# Patient Record
Sex: Female | Born: 1965 | Race: White | Hispanic: No | Smoking: Current some day smoker
Health system: Southern US, Community
[De-identification: ages and names within clinical notes are randomized; demographics above are authoritative.]

## PROBLEM LIST (undated history)

## (undated) DIAGNOSIS — K219 Gastro-esophageal reflux disease without esophagitis: Secondary | ICD-10-CM

## (undated) DIAGNOSIS — J45909 Unspecified asthma, uncomplicated: Secondary | ICD-10-CM

## (undated) DIAGNOSIS — M1990 Unspecified osteoarthritis, unspecified site: Secondary | ICD-10-CM

## (undated) DIAGNOSIS — G629 Polyneuropathy, unspecified: Secondary | ICD-10-CM

## (undated) HISTORY — PX: HERNIA REPAIR: SHX51

## (undated) HISTORY — PX: CHOLECYSTECTOMY: SHX55

## (undated) HISTORY — PX: TUBAL LIGATION: SHX77

## (undated) HISTORY — PX: TONSILLECTOMY: SUR1361

## (undated) HISTORY — PX: LAPAROSCOPIC GASTRIC SLEEVE RESECTION: SHX5895

---

## 2005-04-10 ENCOUNTER — Other Ambulatory Visit: Payer: Self-pay

## 2005-04-10 ENCOUNTER — Emergency Department: Payer: Self-pay | Admitting: Emergency Medicine

## 2006-08-20 ENCOUNTER — Ambulatory Visit: Payer: Self-pay | Admitting: Surgery

## 2006-08-21 ENCOUNTER — Ambulatory Visit: Payer: Self-pay | Admitting: Surgery

## 2007-11-13 ENCOUNTER — Ambulatory Visit: Payer: Self-pay | Admitting: Family Medicine

## 2014-06-26 ENCOUNTER — Ambulatory Visit
Admission: EM | Admit: 2014-06-26 | Discharge: 2014-06-26 | Disposition: A | Discharge status: Home / Self Care | Payer: BLUE CROSS/BLUE SHIELD | Attending: Family Medicine | Admitting: Family Medicine

## 2014-06-26 ENCOUNTER — Encounter: Payer: Self-pay | Admitting: Emergency Medicine

## 2014-06-26 DIAGNOSIS — B029 Zoster without complications: Secondary | ICD-10-CM

## 2014-06-26 DIAGNOSIS — R21 Rash and other nonspecific skin eruption: Secondary | ICD-10-CM

## 2014-06-26 HISTORY — DX: Unspecified asthma, uncomplicated: J45.909

## 2014-06-26 HISTORY — DX: Gastro-esophageal reflux disease without esophagitis: K21.9

## 2014-06-26 MED ORDER — MUPIROCIN 2 % EX OINT: 1.0000 "application " | TOPICAL_OINTMENT | Freq: Three times a day (TID) | CUTANEOUS | Status: DC

## 2014-06-26 MED ORDER — VALACYCLOVIR HCL 1 G PO TABS: 1000.0000 mg | ORAL_TABLET | Freq: Three times a day (TID) | ORAL | Status: AC

## 2014-06-26 NOTE — Discharge Instructions (Signed)
Shingles Shingles is caused by the same virus that causes chickenpox. The first feelings may be pain or tingling. A rash will follow in a couple days. The rash may occur on any area of the body. Long-lasting pain is more likely in an elderly person. It can last months to years. There are medicines that can help prevent pain if you start taking them early. HOME CARE   Take cool baths or place cool cloths on the rash as told by your doctor.  Take medicine only as told by your doctor.  Rest as told by your doctor.  Keep your rash clean with mild soap and cool water or as told by your doctor.  Do not scratch your rash. You may use calamine lotion to relieve itchy skin as told by your doctor.  Keep your rash covered with a loose bandage (dressing).  Avoid touching:  Babies.  Pregnant women.  Children with inflamed skin (eczema).  People who have gotten organ transplants.  People with chronic illnesses, such as leukemia or AIDS.  Wear loose-fitting clothing.  If the rash is on the face, you may need to see a specialist. Keep all appointments. Shingles must be kept away from the eyes, if possible.  Keep all follow-up visits as told by your doctor. GET HELP RIGHT AWAY IF:   You have any pain on the face or eye.  You lose feeling on one side of your face.  You have ear pain or ringing in your ear.  You cannot taste as well.  Your medicines do not help the pain.  Your redness or puffiness (swelling) spreads.  You feel like you are getting worse.  You have a fever. MAKE SURE YOU:   Understand these instructions.  Will watch your condition.  Will get help right away if you are not doing well or get worse. Document Released: 07/17/2007 Document Revised: 06/14/2013 Document Reviewed: 07/17/2007 ExitCare Patient Information 2015 [employer]. This information is not intended to replace advice given to you by your health care provider. Make sure you discuss any questions  you have with your health care provider.  

## 2014-06-26 NOTE — ED Provider Notes (Signed)
CSN: [id number]     Arrival date & time 06/26/14  1002 History   First MD Initiated Contact with Patient 06/26/14 1136     Chief Complaint  Patient presents with  . Rash   (Consider location/radiation/quality/duration/timing/severity/associated sxs/prior Treatment) Patient is a 49 y.o. female presenting with rash. The history is provided by the patient. No language interpreter was used.  Rash Location:  Head/neck (Rash started bothering her Thursday night and by Friday she saw a few bumps and is bumps only got worse between) Head/neck rash location:  L neck Quality: itchiness, painful, redness and weeping   Pain details:    Quality:  Burning   Severity:  Moderate   Onset quality:  Sudden   Duration:  4 days Severity:  Moderate Duration:  4 days Timing:  Constant Progression:  Worsening Chronicity:  New Context: not animal contact, not chemical exposure, not diapers, not eggs, not exposure to similar rash, not hot tub use, not insect bite/sting, not medications, not new detergent/soap, not nuts, not plant contact, not pollen, not sick contacts and not sun exposure   Relieved by:  Nothing Worsened by:  Nothing tried Ineffective treatments:  None tried Associated symptoms: induration   Associated symptoms: no abdominal pain, no fever, no sore throat and no tongue swelling     Past Medical History  Diagnosis Date  . Asthma   . GERD (gastroesophageal reflux disease)    Past Surgical History  Procedure Laterality Date  . Hernia repair    . Tonsillectomy    . Cholecystectomy    . Tubal ligation    . Laparoscopic gastric sleeve resection     History reviewed. No pertinent family history. History  Substance Use Topics  . Smoking status: Former Smoker    Quit date: 06/26/2007  . Smokeless tobacco: Former NeurosurgeonUser    Quit date: 06/26/2007  . Alcohol Use: Yes   OB History    No data available     Review of Systems  Constitutional: Negative.  Negative for fever.  HENT:  Negative for sore throat.   Gastrointestinal: Negative for abdominal pain.  Skin: Positive for rash.    Allergies  Review of patient's allergies indicates no known allergies.  Home Medications   Prior to Admission medications   Medication Sig Start Date End Date Taking? Authorizing Provider  fluticasone (FLOVENT HFA) 110 MCG/ACT inhaler Inhale 2 puffs into the lungs as needed.   Yes Historical Provider, MD  norethindrone-ethinyl estradiol 1/35 (ORTHO-NOVUM, NORTREL,CYCLAFEM) tablet Take 1 tablet by mouth daily.   Yes Historical Provider, MD  omeprazole (PRILOSEC) 40 MG capsule Take 40 mg by mouth daily.   Yes Historical Provider, MD  ranitidine (ZANTAC) 150 MG tablet Take 150 mg by mouth 2 (two) times daily.   Yes Historical Provider, MD  tiotropium (SPIRIVA) 18 MCG inhalation capsule Place 18 mcg into inhaler and inhale as needed.   Yes Historical Provider, MD  mupirocin ointment (BACTROBAN) 2 % Apply 1 application topically 3 (three) times daily. 06/26/14   Hassan RowanEugene Kasen Adduci, MD  valACYclovir (VALTREX) 1000 MG tablet Take 1 tablet (1,000 mg total) by mouth 3 (three) times daily. For one week 06/26/14 07/10/14  Hassan RowanEugene Brinkley Peet, MD   BP 127/77 mmHg  Pulse 60  Temp(Src) 97.8 F (36.6 C) (Oral)  Resp 18  Ht 5\' 1"  (1.549 m)  Wt 271 lb (122.925 kg)  BMI 51.23 kg/m2  SpO2 99%  LMP 06/24/2014 Physical Exam  Constitutional: She is oriented to person, place, and time.  Obese white female  Eyes: Pupils are equal, round, and reactive to light.  Neck: Neck supple.  Musculoskeletal: Normal range of motion.  Lymphadenopathy:    She has cervical adenopathy.  Neurological: She is oriented to person, place, and time.  Skin: Rash noted. There is erythema.  She has a rash on the left side of her neck that is moving along what appears be a dermatome pattern towards her face. This rash could be consistent with an early shingles infection. Should be noted that she did fine and none embedded tick on her neck  Thursday this is a multiple lesion illness right now and she never felt a bite from the tic  Vitals reviewed.   ED Course  Procedures (including critical care time) Labs Review Labs Reviewed - No data to display  Imaging Review No results found. She talked to a nurse at the office she goes to who told her she needed to come here to be placed on antiviral agent and she is requesting antiviral age as I tried to explain to her what shingles is and why we treat shingles infections. We'll place on Valtrex 1gr for a week 3 times a day and bactroban ointment. MDM   1. Shingles outbreak   2. Rash        Hassan RowanEugene Jaquell Seddon, MD 06/26/14 [phone]

## 2014-06-26 NOTE — ED Notes (Signed)
Patient c/o rashes to the lt neck behind the lt ear, thinks that it could be shingles, c/o itching and redness since 2 days, denies any other symptoms

## 2014-06-29 ENCOUNTER — Encounter: Payer: Self-pay | Admitting: Family Medicine

## 2014-06-29 ENCOUNTER — Ambulatory Visit
Admission: EM | Admit: 2014-06-29 | Discharge: 2014-06-29 | Disposition: A | Discharge status: Home / Self Care | Payer: BLUE CROSS/BLUE SHIELD | Attending: Family Medicine | Admitting: Family Medicine

## 2014-06-29 DIAGNOSIS — L03116 Cellulitis of left lower limb: Secondary | ICD-10-CM | POA: Diagnosis not present

## 2014-06-29 MED ORDER — SULFAMETHOXAZOLE-TRIMETHOPRIM 800-160 MG PO TABS: 1.0000 | ORAL_TABLET | Freq: Two times a day (BID) | ORAL | Status: AC

## 2014-06-29 NOTE — ED Notes (Signed)
Om Mothers day 10 days ago she hit her toe under the door as she was coming out of the bathroom. Her foot and lower leg is red and swollen. Her great toe has a bruise.

## 2014-06-29 NOTE — ED Provider Notes (Signed)
Patient presents with injury to the left foot area for over a week. Patient states that she hit her toe under the door. Since that time the patient has noticed redness swelling of the left big toe has now extended to the foot and ankle area. Patient denies any calf pain. She denies any fever or chills. She does admit to pus and blood coming from the left big toe a few days ago. Patient has full motion of her toes and foot.  ROS: Negative except mentioned above.  GENERAL: NAD HEENT: no pharyngeal erythema, no exudate, no erythema of TMs RESP: CTA B CARD: RRR MSK: LLE: mild erythema and tenderness around left big toe cuticle area, no pus pockets, no active bleeding, minimal erythema and tenderness of the dorsal aspect of the foot, moderate erythema, warmth, and tenderness of the distal lower leg area (anterior ankle area), FROM of toes and foot, no streaks to upper leg area, -Homans, nv intact  NEURO: CN II-XII groslly intact     A/P: LLE Cellulitis-x-rays of the toe were declined by patient, Bactrim DS 10 days, encourage patient on elevating the foot/leg, would like patient to return in 2 days for follow-up. Patient is to return sooner if symptoms worsen.  Jolene ProvostKirtida Darrelle Wiberg, MD 06/29/14 [phone]

## 2014-07-01 ENCOUNTER — Ambulatory Visit
Admission: EM | Admit: 2014-07-01 | Discharge: 2014-07-01 | Disposition: A | Discharge status: Home / Self Care | Payer: BLUE CROSS/BLUE SHIELD | Attending: Family Medicine | Admitting: Family Medicine

## 2014-07-01 ENCOUNTER — Ambulatory Visit: Payer: BLUE CROSS/BLUE SHIELD

## 2014-07-01 DIAGNOSIS — M19072 Primary osteoarthritis, left ankle and foot: Secondary | ICD-10-CM

## 2014-07-01 DIAGNOSIS — Z87891 Personal history of nicotine dependence: Secondary | ICD-10-CM | POA: Insufficient documentation

## 2014-07-01 DIAGNOSIS — M129 Arthropathy, unspecified: Secondary | ICD-10-CM | POA: Diagnosis not present

## 2014-07-01 DIAGNOSIS — M19079 Primary osteoarthritis, unspecified ankle and foot: Secondary | ICD-10-CM

## 2014-07-01 DIAGNOSIS — J45909 Unspecified asthma, uncomplicated: Secondary | ICD-10-CM | POA: Insufficient documentation

## 2014-07-01 DIAGNOSIS — M1990 Unspecified osteoarthritis, unspecified site: Secondary | ICD-10-CM

## 2014-07-01 DIAGNOSIS — M7732 Calcaneal spur, left foot: Secondary | ICD-10-CM | POA: Insufficient documentation

## 2014-07-01 DIAGNOSIS — L03116 Cellulitis of left lower limb: Secondary | ICD-10-CM | POA: Diagnosis not present

## 2014-07-01 DIAGNOSIS — K219 Gastro-esophageal reflux disease without esophagitis: Secondary | ICD-10-CM | POA: Insufficient documentation

## 2014-07-01 MED ORDER — CEFAZOLIN SODIUM 1 G IJ SOLR
1.0000 g | Freq: Once | INTRAMUSCULAR | Status: AC
  Administered 2014-07-01: 1 g via INTRAMUSCULAR

## 2014-07-01 MED ORDER — CIPROFLOXACIN HCL 500 MG PO TABS: 500.0000 mg | ORAL_TABLET | Freq: Two times a day (BID) | ORAL | Status: DC

## 2014-07-01 MED ORDER — ACETAMINOPHEN 325 MG PO TABS: 975.0000 mg | ORAL_TABLET | Freq: Four times a day (QID) | ORAL | Status: AC | PRN

## 2014-07-01 NOTE — Discharge Instructions (Signed)
Arthritis, Nonspecific Arthritis is pain, redness, warmth, or puffiness (inflammation) of a joint. The joint may be stiff or hurt when you move it. One or more joints may be affected. There are many types of arthritis. Your doctor may not know what type you have right away. The most common cause of arthritis is wear and tear on the joint (osteoarthritis). HOME CARE   Only take medicine as told by your doctor.  Rest the joint as much as possible.  Raise (elevate) your joint if it is puffy.  Use crutches if the painful joint is in your leg.  Drink enough fluids to keep your pee (urine) clear or pale yellow.  Follow your doctor's diet instructions.  Use cold packs for very bad joint pain for 10 to 15 minutes every hour. Ask your doctor if it is okay for you to use hot packs.  Exercise as told by your doctor.  Take a warm shower if you have stiffness in the morning.  Move your sore joints throughout the day. GET HELP RIGHT AWAY IF:   You have a fever.  You have very bad joint pain, puffiness, or redness.  You have many joints that are painful and puffy.  You are not getting better with treatment.  You have very bad back pain or leg weakness.  You cannot control when you poop (bowel movement) or pee (urinate).  You do not feel better in 24 hours or are getting worse.  You are having side effects from your medicine. MAKE SURE YOU:   Understand these instructions.  Will watch your condition.  Will get help right away if you are not doing well or get worse. Document Released: 04/24/2009 Document Revised: 07/30/2011 Document Reviewed: 04/24/2009 [facility] Patient Information 2015 [employer], [place]. This information is not intended to replace advice given to you by your health care provider. Make sure you discuss any questions you have with your health care provider. Cellulitis Cellulitis is an infection of the skin and the tissue beneath it. The infected area is usually red and  tender. Cellulitis occurs most often in the arms and lower legs.  CAUSES  Cellulitis is caused by bacteria that enter the skin through cracks or cuts in the skin. The most common types of bacteria that cause cellulitis are staphylococci and streptococci. SIGNS AND SYMPTOMS   Redness and warmth.  Swelling.  Tenderness or pain.  Fever. DIAGNOSIS  Your health care provider can usually determine what is wrong based on a physical exam. Blood tests may also be done. TREATMENT  Treatment usually involves taking an antibiotic medicine. HOME CARE INSTRUCTIONS   Take your antibiotic medicine as directed by your health care provider. Finish the antibiotic even if you start to feel better.  Keep the infected arm or leg elevated to reduce swelling.  Apply a warm cloth to the affected area up to 4 times per day to relieve pain.  Take medicines only as directed by your health care provider.  Keep all follow-up visits as directed by your health care provider. SEEK MEDICAL CARE IF:   You notice red streaks coming from the infected area.  Your red area gets larger or turns dark in color.  Your bone or joint underneath the infected area becomes painful after the skin has healed.  Your infection returns in the same area or another area.  You notice a swollen bump in the infected area.  You develop new symptoms.  You have a fever. SEEK IMMEDIATE MEDICAL CARE IF:  You feel very sleepy.  You develop vomiting or diarrhea.  You have a general ill feeling (malaise) with muscle aches and pains. MAKE SURE YOU:   Understand these instructions.  Will watch your condition.  Will get help right away if you are not doing well or get worse. Document Released: 11/07/2004 Document Revised: 06/14/2013 Document Reviewed: 04/15/2011 [facility] InstituteExit[facility] Patient Information 2015 [employer]Care, [place]. This information is not intended to replace advice given to you by your health care provider. Make sure you discuss  any questions you have with your health care provider.

## 2014-07-01 NOTE — ED Provider Notes (Signed)
CSN: [id number]     Arrival date & time 07/01/14  0806 History   None    Chief Complaint  Patient presents with  . 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  Seen 18 May Dr Allena Katz for left lower extremity cellulitis started on Bactrim DS 5 doses completed and stayed home from work x 2 days elevated extremity.  Worsening pain, erythema of toes but pussy drainage left great toe resolved.  Redness anterior shin decreased but still having pain and itching.  Decreased AROM flexion of toes and more pain with toe flexion left foot today.  Patient reports history of bilateral lower leg swelling and discoloration of skin chronic wears support stockings below the knee at work and has tried to lose weight approximately 100lbs per patient in past couple of years.  Patient supposed to return to work today.  Patient is a 49 y.o. female presenting with wound check. The history is provided by the patient.  Wound Check This is a chronic problem. The current episode started more than 2 days ago. The problem occurs constantly. The problem has been gradually worsening. Pertinent negatives include no chest pain, no abdominal pain, no headaches and no shortness of breath. The symptoms are aggravated by standing, exertion and walking. Nothing relieves the symptoms. She has tried rest for the symptoms. The treatment provided mild relief.    Past Medical History  Diagnosis Date  . Asthma   . GERD (gastroesophageal reflux disease)    Past Surgical History  Procedure Laterality Date  . Hernia repair    . Tonsillectomy    . Cholecystectomy    . Tubal ligation    . Laparoscopic gastric sleeve resection     Family History  Problem Relation Age of Onset  . Cancer Mother    History  Substance Use Topics  . Smoking  status: Former Smoker    Quit date: 06/26/2007  . Smokeless tobacco: Former Neurosurgeon    Quit date: 06/26/2007  . Alcohol Use: Yes     Comment: socially   OB History    Gravida Para Term Preterm AB TAB SAB Ectopic Multiple Living   2 2             Review of Systems  Constitutional: Negative for fever, chills, diaphoresis, activity change, appetite change and fatigue.  HENT: Negative for congestion, postnasal drip, rhinorrhea, sneezing and sore throat.   Eyes: Negative for photophobia, pain, discharge, redness and itching.  Respiratory: Negative for cough, choking, chest tightness and shortness of breath.   Cardiovascular: Positive for leg swelling. Negative for chest pain and palpitations.  Gastrointestinal: Negative for nausea, vomiting, abdominal pain, diarrhea and constipation.  Endocrine: Negative for cold intolerance and heat intolerance.  Genitourinary: Negative for dysuria and difficulty urinating.  Musculoskeletal: Positive for myalgias, joint swelling, arthralgias and gait problem. Negative for back pain, neck pain and neck stiffness.  Skin: Positive for color change and rash. Negative for pallor and wound.  Allergic/Immunologic: Negative for environmental allergies and food allergies.  Neurological: Negative for dizziness, tremors, syncope, weakness, light-headedness, numbness and headaches.  Hematological: Does not bruise/bleed easily.  Psychiatric/Behavioral: Negative for confusion and agitation. The patient is not nervous/anxious and is not hyperactive.     Allergies  Review of patient's allergies indicates no known allergies.  Home Medications   Prior to Admission medications   Medication Sig  Start Date End Date Taking? Authorizing Provider  fluticasone (FLOVENT HFA) 110 MCG/ACT inhaler Inhale 2 puffs into the lungs as needed.   Yes Historical Provider, MD  mupirocin ointment (BACTROBAN) 2 % Apply 1 application topically 3 (three) times daily. 06/26/14  Yes Hassan RowanEugene Wade,  MD  norethindrone-ethinyl estradiol 1/35 (ORTHO-NOVUM, NORTREL,CYCLAFEM) tablet Take 1 tablet by mouth daily.   Yes Historical Provider, MD  omeprazole (PRILOSEC) 40 MG capsule Take 40 mg by mouth daily.   Yes Historical Provider, MD  ranitidine (ZANTAC) 150 MG tablet Take 150 mg by mouth 2 (two) times daily.   Yes Historical Provider, MD  sulfamethoxazole-trimethoprim (BACTRIM DS,SEPTRA DS) 800-160 MG per tablet Take 1 tablet by mouth 2 (two) times daily. 06/29/14 07/09/14 Yes Jolene ProvostKirtida Patel, MD  tiotropium (SPIRIVA) 18 MCG inhalation capsule Place 18 mcg into inhaler and inhale as needed.   Yes Historical Provider, MD  valACYclovir (VALTREX) 1000 MG tablet Take 1 tablet (1,000 mg total) by mouth 3 (three) times daily. For one week 06/26/14 07/10/14 Yes Hassan RowanEugene Wade, MD  acetaminophen (TYLENOL) 325 MG tablet Take 3 tablets (975 mg total) by mouth every 6 (six) hours as needed for moderate pain. 07/01/14   Barbaraann Barthelina A Betancourt, NP  ciprofloxacin (CIPRO) 500 MG tablet Take 1 tablet (500 mg total) by mouth every 12 (twelve) hours. 07/01/14   Jarold Songina A Betancourt, NP   BP 146/81 mmHg  Pulse 74  Temp(Src) 98.5 F (36.9 C) (Tympanic)  Resp 16  Ht 5\' 1"  (1.549 m)  Wt 271 lb (122.925 kg)  BMI 51.23 kg/m2  SpO2 100%  LMP 06/24/2014 (Approximate) Physical Exam  Constitutional: She is oriented to person, place, and time. Vital signs are normal. She appears well-developed and well-nourished. No distress.  HENT:  Head: Normocephalic and atraumatic.  Right Ear: External ear normal.  Left Ear: External ear normal.  Mouth/Throat: Oropharynx is clear and moist. No oropharyngeal exudate.  Eyes: Conjunctivae, EOM and lids are normal. Pupils are equal, round, and reactive to light. Right eye exhibits no discharge. Left eye exhibits no discharge. No scleral icterus.  Neck: Trachea normal and normal range of motion. Neck supple. No tracheal deviation present.  Cardiovascular: Normal rate, regular rhythm, normal heart  sounds and intact distal pulses.   Pulmonary/Chest: Effort normal and breath sounds normal. No respiratory distress. She has no wheezes.  Musculoskeletal: She exhibits edema and tenderness.       Left ankle: She exhibits decreased range of motion, swelling and ecchymosis. She exhibits no deformity, no laceration and normal pulse. Tenderness. Achilles tendon normal. Achilles tendon exhibits no pain, no defect and normal Thompson's test results.       Legs:      Feet:  Neurological: She is alert and oriented to person, place, and time. She displays normal reflexes.  Skin: Skin is warm, dry and intact. Rash noted. She is not diaphoretic. There is erythema. No pallor.  Psychiatric: She has a normal mood and affect. Her speech is normal and behavior is normal. Judgment and thought content normal. Cognition and memory are normal.  Nursing note and vitals reviewed.   ED Course  Procedures (including critical care time) Labs Review Labs Reviewed - No data to display  Imaging Review Dg Foot Complete Left  07/01/2014   CLINICAL DATA:  Cellulitis of the left foot. Blunt trauma and laceration 1.5 weeks ago. Pain. Redness.  EXAM: LEFT FOOT - COMPLETE 3+ VIEW  COMPARISON:  None.  FINDINGS: There is no fracture or dislocation. Moderate  midfoot arthritis. Slight arthritic changes at the ankle joint. Prominent plantar calcaneal spur. Slight are severe arthritis of the first metatarsal phalangeal joint with slight hallux valgus deformity and bunion formation.  No evidence of bone destruction or other findings suggestive of osteomyelitis.  IMPRESSION: No acute osseous abnormalities. Degenerative changes as described. No findings suggestive of osteomyelitis.   Electronically Signed   By: Francene BoyersJames  Maxwell M.D.   On: 07/01/2014 09:27     MDM   1. Cellulitis of left lower extremity   2. Arthritis of foot, left   3. Calcaneal spur of foot, left   4. 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  Ancef 1gm IM x 1 and will add ciprofloxacin 500mg  po BID to Bactrim DS po BID continue both until finished.  Discussed with patient to use back up contraception this month e.g. Condoms or abstinence.  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  Continue bactroban topical to left great toe.  May take tylenol 1000mg  po QID prn pain.   Follow up for re-evaluation with Dr Allena KatzPatel in am 02 Jul 2014.  Exitcare handout on skin infection given to patient.  RTC if worsening erythema, joint or muscle pain, purulent discharge, fever greater than 100.5.  Patient verbalized understanding, agreed with plan of care and had no further questions at this time.   0930 patient notified xray results e.g. No osteomyelitis but arthritis great toe/midfoot left and calcaneal spur.  Discussed slightly elevated heel, large toebox, supportive foot plates recommended along with tylenol and heat for arthritis.  Patient given copy of report.  Patient verbalized understanding of information/instructions, agreed with plan of care and had no further questions at this time.  EXAM: LEFT FOOT - COMPLETE 3+ VIEW  COMPARISON: None.  FINDINGS: There is no fracture or dislocation. Moderate midfoot arthritis. Slight arthritic changes at the ankle joint. Prominent  plantar calcaneal spur. Slight are severe arthritis of the first metatarsal phalangeal joint with slight hallux valgus deformity and bunion formation.  No evidence of bone destruction or other findings suggestive of osteomyelitis.  IMPRESSION: No acute osseous abnormalities. Degenerative changes as described. No findings suggestive of osteomyelitis.   Electronically Signed  By: Francene BoyersJames Maxwell M.D.  On: 07/01/2014 09:27  Barbaraann Barthelina A Betancourt, NP 07/01/14 [phone]

## 2014-07-01 NOTE — ED Notes (Signed)
1 1/2 weeks ago stubbed left great toe and started with cellulitis. Less swelling but severe burning pain. Left toes "itching" For recheck

## 2016-11-11 IMAGING — CR DG FOOT COMPLETE 3+V*L*
4 series · 4 of 4 positions shown · non-contrast
Comparison: None.

CLINICAL DATA: Cellulitis of the left foot. Blunt trauma and
laceration 1.5 weeks ago. Pain. Redness.

EXAM:
LEFT FOOT - COMPLETE 3+ VIEW

[foot ap]
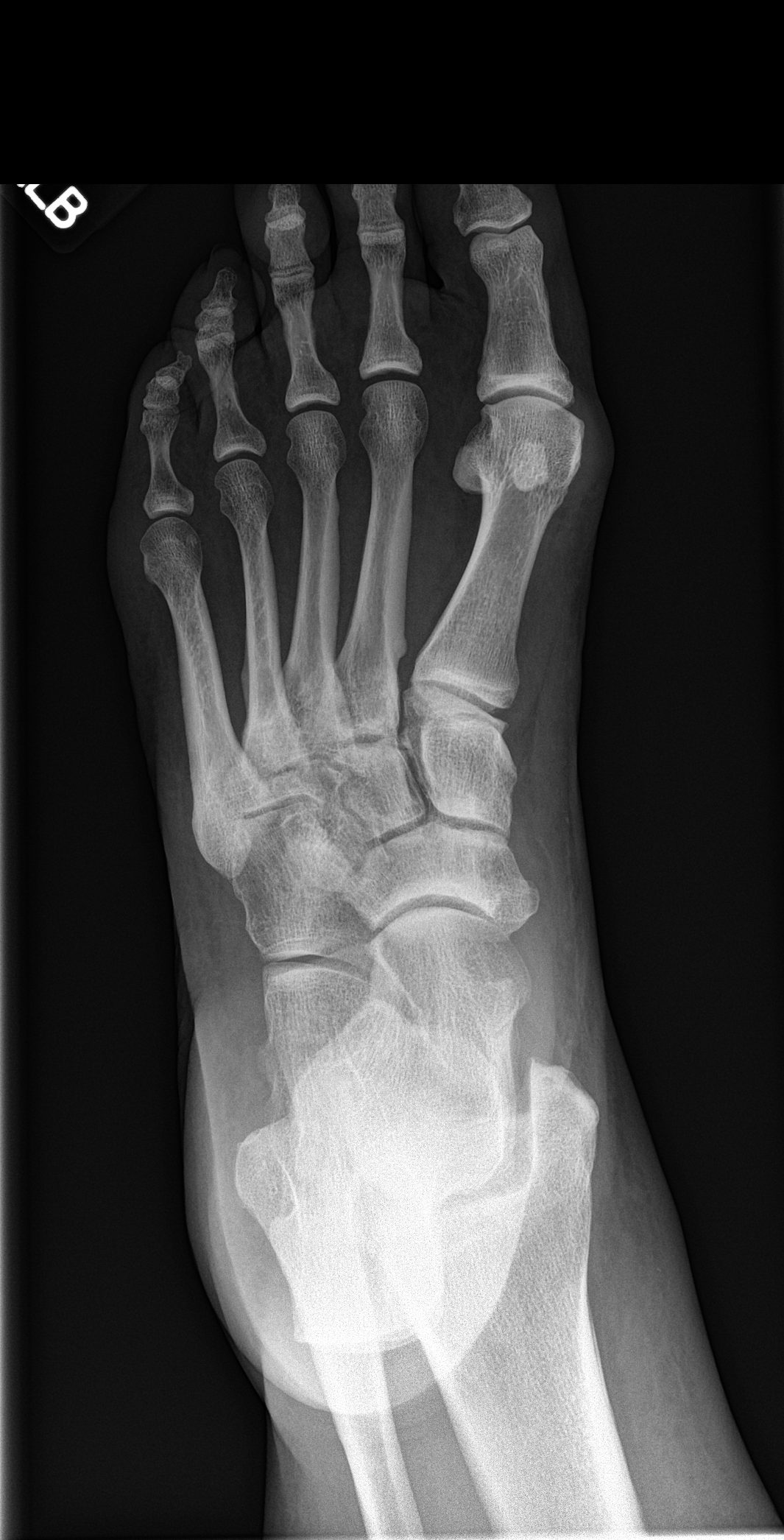

[foot obl]
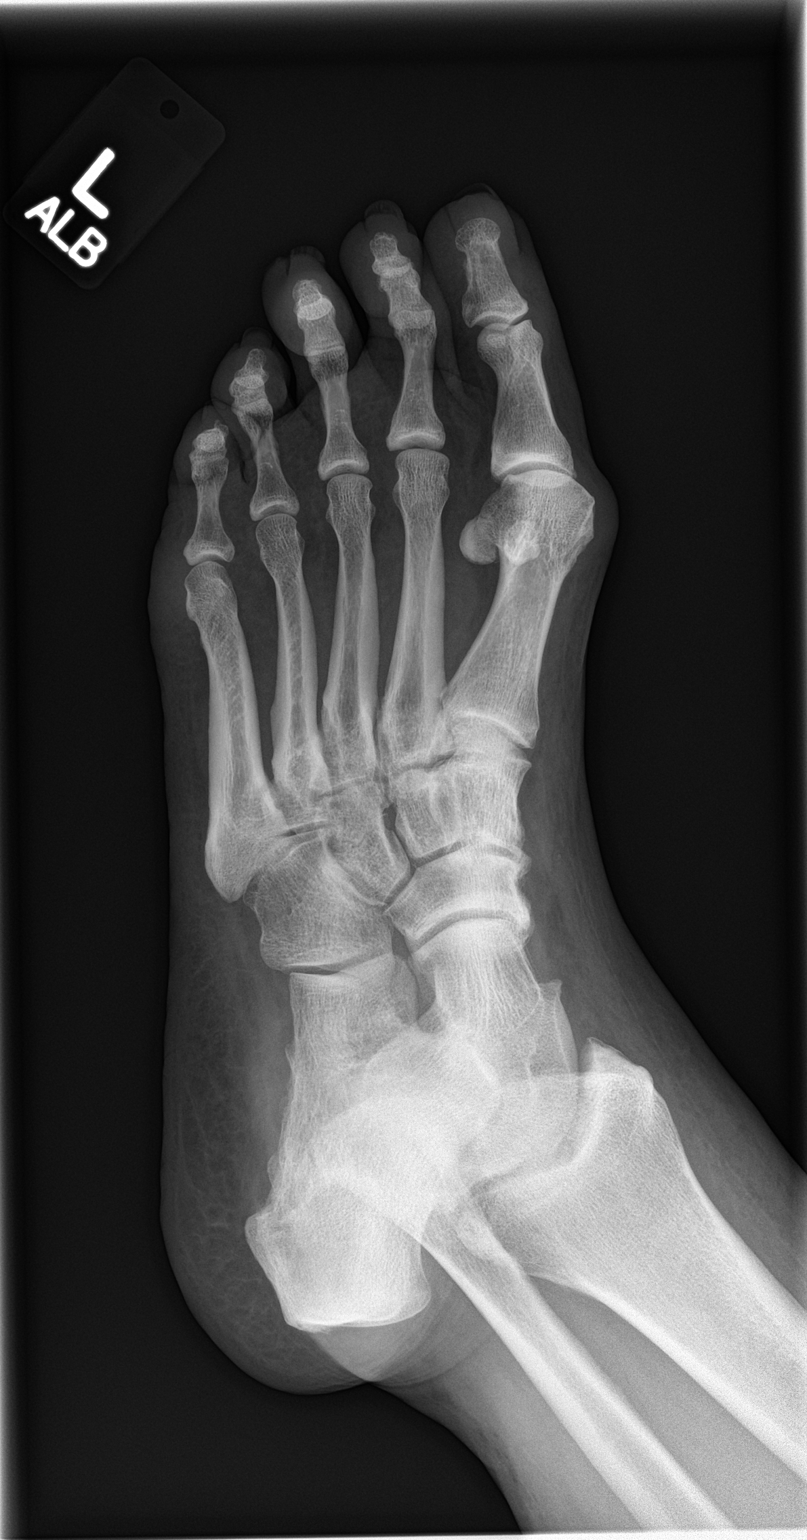

[foot lat (1 of 2)]
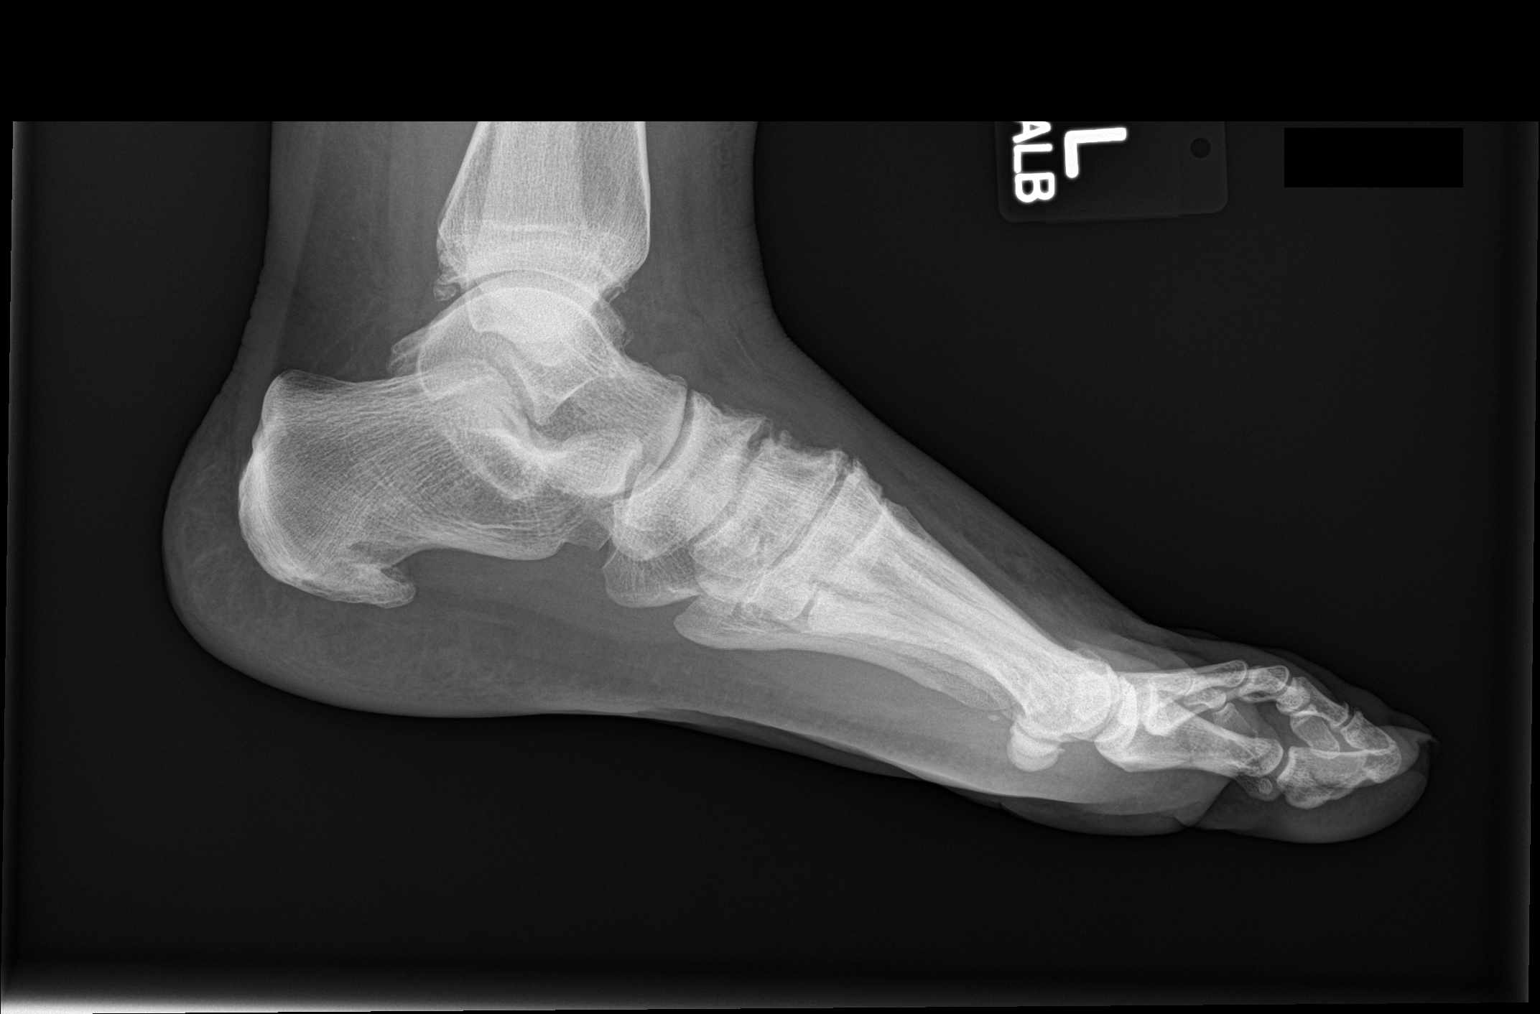

[foot lat (2 of 2)]
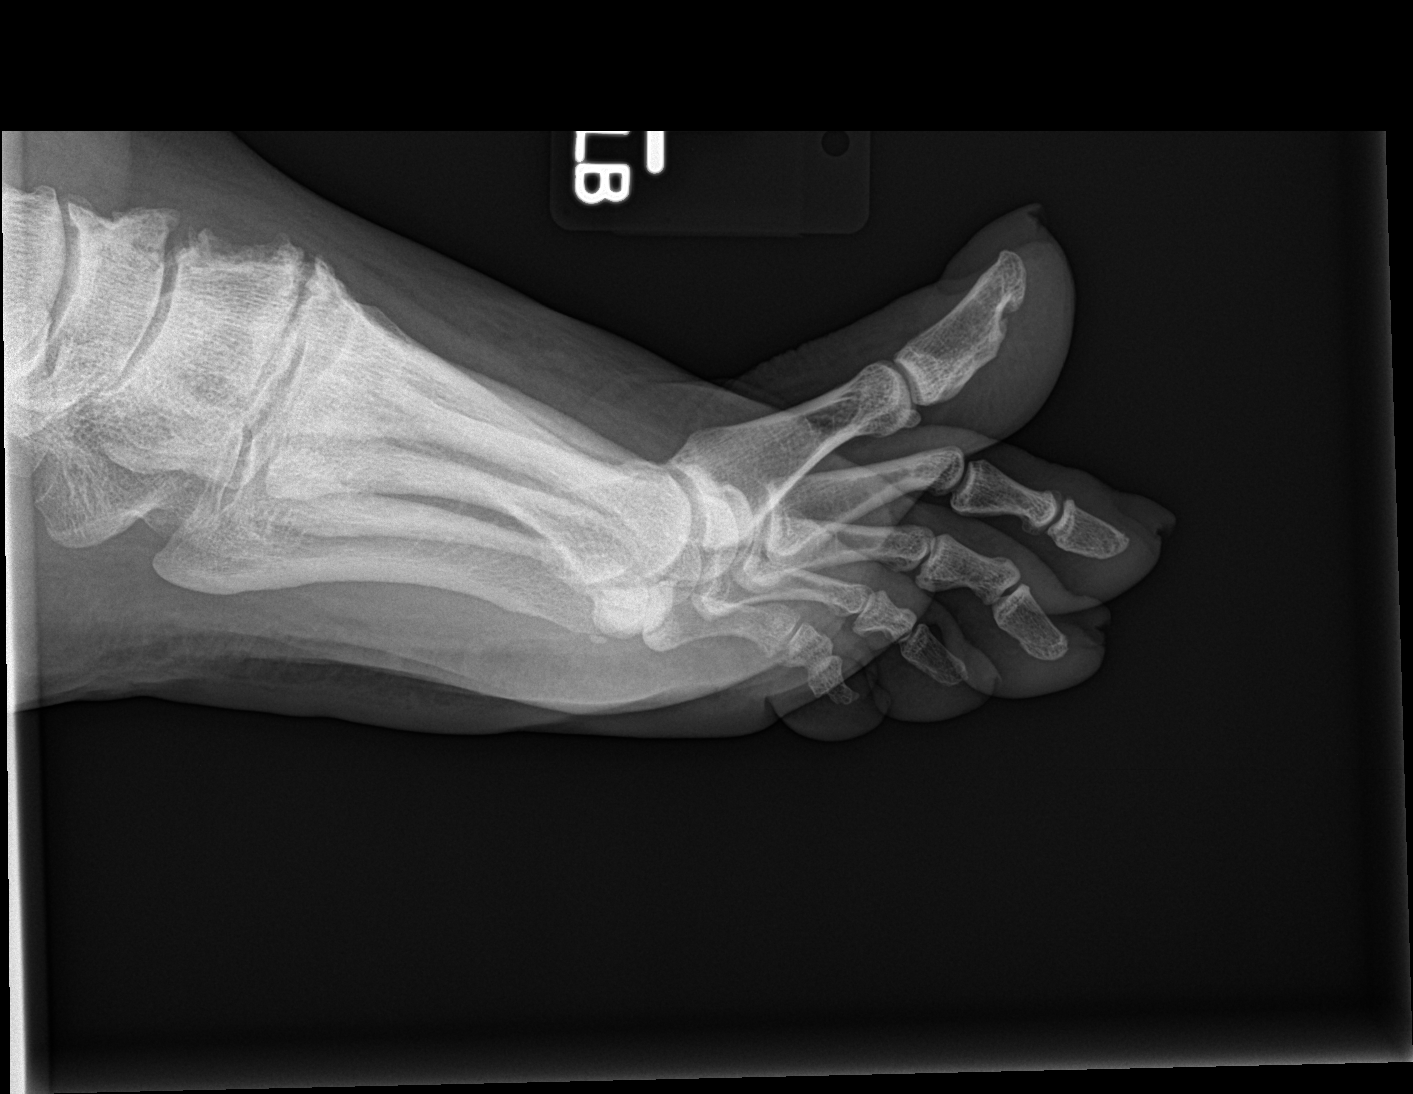

[4 of 4 positions shown; findings below may reference images not displayed]

FINDINGS: There is no fracture or dislocation. Moderate midfoot arthritis.
Slight arthritic changes at the ankle joint. Prominent plantar
calcaneal spur. Slight are severe arthritis of the first metatarsal
phalangeal joint with slight hallux valgus deformity and bunion
formation.

No evidence of bone destruction or other findings suggestive of
osteomyelitis.
IMPRESSION: No acute osseous abnormalities. Degenerative changes as described.
No findings suggestive of osteomyelitis.

## 2017-05-22 ENCOUNTER — Other Ambulatory Visit: Payer: Self-pay | Admitting: Family Medicine

## 2017-05-22 DIAGNOSIS — Z1231 Encounter for screening mammogram for malignant neoplasm of breast: Secondary | ICD-10-CM

## 2017-05-26 ENCOUNTER — Other Ambulatory Visit: Payer: Self-pay | Admitting: Family Medicine

## 2017-05-29 ENCOUNTER — Other Ambulatory Visit: Payer: Self-pay | Admitting: Family Medicine

## 2017-05-29 DIAGNOSIS — N6320 Unspecified lump in the left breast, unspecified quadrant: Secondary | ICD-10-CM

## 2017-06-16 ENCOUNTER — Ambulatory Visit
Admission: RE | Admit: 2017-06-16 | Discharge: 2017-06-16 | Disposition: A | Discharge status: Home / Self Care | Payer: BLUE CROSS/BLUE SHIELD | Source: Ambulatory Visit | Attending: Family Medicine | Admitting: Family Medicine

## 2017-06-16 DIAGNOSIS — N6320 Unspecified lump in the left breast, unspecified quadrant: Secondary | ICD-10-CM

## 2017-06-16 DIAGNOSIS — Z1231 Encounter for screening mammogram for malignant neoplasm of breast: Secondary | ICD-10-CM

## 2017-06-16 DIAGNOSIS — N6311 Unspecified lump in the right breast, upper outer quadrant: Secondary | ICD-10-CM | POA: Insufficient documentation

## 2017-09-28 ENCOUNTER — Other Ambulatory Visit: Payer: Self-pay

## 2017-09-28 ENCOUNTER — Ambulatory Visit
Admission: EM | Admit: 2017-09-28 | Discharge: 2017-09-28 | Disposition: A | Discharge status: Home / Self Care | Payer: BLUE CROSS/BLUE SHIELD | Attending: Internal Medicine | Admitting: Internal Medicine

## 2017-09-28 DIAGNOSIS — L03113 Cellulitis of right upper limb: Secondary | ICD-10-CM | POA: Diagnosis not present

## 2017-09-28 HISTORY — DX: Polyneuropathy, unspecified: G62.9

## 2017-09-28 HISTORY — DX: Unspecified osteoarthritis, unspecified site: M19.90

## 2017-09-28 MED ORDER — CEPHALEXIN 500 MG PO CAPS: 500.0000 mg | ORAL_CAPSULE | Freq: Three times a day (TID) | ORAL | 0 refills | Status: AC

## 2017-09-28 NOTE — ED Provider Notes (Signed)
[facility]    CSN: [id number] Arrival date & time: 09/28/17  1425     History   Chief Complaint Chief Complaint  Patient presents with  . Cellulitis    HPI Catherine Nolan is a 52 y.o. female.   PAtient fell 5 days ago and sustained abrasion to right forearm.  Initially red and weeping. Patient reports expressing blood and pus from the wound. Swelling receded but it is now more painful and hot to touch. She denies fever, nausea, or vomiting.     Past Medical History:  Diagnosis Date  . Arthritis   . Asthma   . GERD (gastroesophageal reflux disease)   . Neuropathy     There are no active problems to display for this patient.   Past Surgical History:  Procedure Laterality Date  . CHOLECYSTECTOMY    . HERNIA REPAIR    . LAPAROSCOPIC GASTRIC SLEEVE RESECTION    . TONSILLECTOMY    . TUBAL LIGATION      OB History    Gravida  2   Para  2   Term      Preterm      AB      Living        SAB      TAB      Ectopic      Multiple      Live Births               Home Medications    Prior to Admission medications   Medication Sig Start Date End Date Taking? Authorizing Provider  albuterol (PROAIR HFA) 108 (90 Base) MCG/ACT inhaler Inhale into the lungs. 10/04/13  Yes [provider]  gabapentin (NEURONTIN) 800 MG tablet Take by mouth. 10/20/13  Yes [provider]  acetaminophen (TYLENOL) 325 MG tablet Take 3 tablets (975 mg total) by mouth every 6 (six) hours as needed for moderate pain. 07/01/14   Betancourt, Jarold Songina A, NP  cephALEXin (KEFLEX) 500 MG capsule Take 1 capsule (500 mg total) by mouth 3 (three) times daily for 7 days. 09/28/17 10/05/17  Arnaldo Nataliamond, Kimari Coudriet S, MD  fluticasone (FLOVENT HFA) 110 MCG/ACT inhaler Inhale 2 puffs into the lungs as needed.    [provider]  meloxicam (MOBIC) 7.5 MG tablet Take by mouth.    [provider]  norethindrone-ethinyl estradiol 1/35 (ORTHO-NOVUM, NORTREL,CYCLAFEM)  tablet Take 1 tablet by mouth daily.    [provider]  omeprazole (PRILOSEC) 40 MG capsule Take 40 mg by mouth daily.    [provider]  ranitidine (ZANTAC) 150 MG tablet Take 150 mg by mouth 2 (two) times daily.    [provider]  tiotropium (SPIRIVA) 18 MCG inhalation capsule Place 18 mcg into inhaler and inhale as needed.    [provider]    Family History Family History  Problem Relation Age of Onset  . Cancer Mother     Social History Social History   Tobacco Use  . Smoking status: Current Some Day Smoker    Types: Cigarettes  . Smokeless tobacco: Former NeurosurgeonUser    Quit date: 06/26/2007  Substance Use Topics  . Alcohol use: Yes    Comment: socially  . Drug use: No     Allergies   Patient has no known allergies.   Review of Systems Review of Systems  Constitutional: Negative for chills and fever.  HENT: Negative for sore throat and tinnitus.   Eyes: Negative for redness.  Respiratory:  Negative for cough and shortness of breath.   Cardiovascular: Negative for chest pain and palpitations.  Gastrointestinal: Negative for abdominal pain, diarrhea, nausea and vomiting.  Genitourinary: Negative for dysuria, frequency and urgency.  Musculoskeletal: Negative for myalgias.  Skin: Positive for wound. Negative for rash.       No lesions  Neurological: Negative for weakness.  Hematological: Does not bruise/bleed easily.  Psychiatric/Behavioral: Negative for suicidal ideas.     Physical Exam Triage Vital Signs ED Triage Vitals  Enc Vitals Group     BP 09/28/17 1442 133/90     Pulse Rate 09/28/17 1442 72     Resp 09/28/17 1442 18     Temp 09/28/17 1442 97.9 F (36.6 C)     Temp Source 09/28/17 1442 Oral     SpO2 09/28/17 1442 99 %     Weight 09/28/17 1443 220 lb (99.8 kg)     Height 09/28/17 1443 5' (1.524 m)     Head Circumference --      Peak Flow --      Pain Score 09/28/17 1443 8     Pain Loc --      Pain Edu? --       Excl. in GC? --    No data found.  Updated Vital Signs BP 133/90 (BP Location: Right Arm)   Pulse 72   Temp 97.9 F (36.6 C) (Oral)   Resp 18   Ht 5' (1.524 m)   Wt 99.8 kg   LMP 06/24/2014 (Approximate) Comment: denies preg  SpO2 99%   BMI 42.97 kg/m   Visual Acuity Right Eye Distance:   Left Eye Distance:   Bilateral Distance:    Right Eye Near:   Left Eye Near:    Bilateral Near:     Physical Exam  Constitutional: She is oriented to person, place, and time. She appears well-developed and well-nourished. No distress.  HENT:  Head: Normocephalic and atraumatic.  Mouth/Throat: Oropharynx is clear and moist.  Eyes: Pupils are equal, round, and reactive to light. Conjunctivae and EOM are normal. No scleral icterus.  Neck: Normal range of motion. Neck supple. No JVD present. No tracheal deviation present. No thyromegaly present.  Cardiovascular: Normal rate, regular rhythm and normal heart sounds. Exam reveals no gallop and no friction rub.  No murmur heard. Pulmonary/Chest: Effort normal and breath sounds normal.  Abdominal: Soft. Bowel sounds are normal. She exhibits no distension. There is no tenderness.  Musculoskeletal: Normal range of motion. She exhibits no edema.  Lymphadenopathy:    She has no cervical adenopathy.  Neurological: She is alert and oriented to person, place, and time. No cranial nerve deficit.  Skin: Skin is warm and dry.  Abrasion of right forearm/elbow with erythema, warmth and swelling  Psychiatric: She has a normal mood and affect. Her behavior is normal. Judgment and thought content normal.  Nursing note and vitals reviewed.    UC Treatments / Results  Labs (all labs ordered are listed, but only abnormal results are displayed) Labs Reviewed - No data to display  EKG None  Radiology No results found.  Procedures Procedures (including critical care time)  Medications Ordered in UC Medications - No data to display  Initial  Impression / Assessment and Plan / UC Course  I have reviewed the triage vital signs and the nursing notes.  Pertinent labs & imaging results that were available during my care of the patient were reviewed by me and considered in my medical decision making (  see chart for details).     Advised 1g Ceftriaxone IM but patient refused injection in buttock. Advised to go to ED for s/s of sepsis. Rx Keflex  Final Clinical Impressions(s) / UC Diagnoses   Final diagnoses:  Cellulitis of right upper extremity   Discharge Instructions   None    ED Prescriptions    Medication Sig Dispense Auth. Provider   cephALEXin (KEFLEX) 500 MG capsule Take 1 capsule (500 mg total) by mouth 3 (three) times daily for 7 days. 21 capsule Arnaldo Nataliamond, Olivia Pavelko S, MD     Controlled Substance Prescriptions [place] Controlled Substance Registry consulted? Not Applicable   Arnaldo Nataliamond, Demarus Latterell S, MD 09/28/17 [id number]

## 2017-09-28 NOTE — ED Triage Notes (Signed)
Pt fell on a curb on Wednesday and injured her right elbow/forearm and right knee. Knee is healing but right elbow is reddened and swollen and itchy. Pain 8/10

## 2018-01-21 ENCOUNTER — Ambulatory Visit
Admission: EM | Admit: 2018-01-21 | Discharge: 2018-01-21 | Disposition: A | Discharge status: Home / Self Care | Payer: BLUE CROSS/BLUE SHIELD | Attending: Family Medicine | Admitting: Family Medicine

## 2018-01-21 ENCOUNTER — Other Ambulatory Visit: Payer: Self-pay

## 2018-01-21 DIAGNOSIS — K130 Diseases of lips: Secondary | ICD-10-CM | POA: Diagnosis not present

## 2018-01-21 MED ORDER — MUPIROCIN 2 % EX OINT: 1.0000 "application " | TOPICAL_OINTMENT | Freq: Two times a day (BID) | CUTANEOUS | 0 refills | Status: AC

## 2018-01-21 NOTE — Discharge Instructions (Signed)
Use the medication as prescribed.  See [facility] regarding your chronic knee pain.  Take care  Dr. Adriana Simasook

## 2018-01-21 NOTE — ED Provider Notes (Signed)
[facility]    CSN: [id number] Arrival date & time: 01/21/18  1636  History   Chief Complaint Lips dry and cracked  HPI  52 year old female presents with the above complaint.  Dry cracked lips for 1 month.  She has been using Vaseline, Abreva, and Nivea frequently.  Denies new products.  Severe.  Lips are bleeding.  Very chapped and dry.  No known inciting factor. No other associated symptoms.  PMH, Surgical Hx, Family Hx, Social History reviewed and updated as below.  Past Medical History:  Diagnosis Date  . Arthritis   . Asthma   . GERD (gastroesophageal reflux disease)   . Neuropathy    Past Surgical History:  Procedure Laterality Date  . CHOLECYSTECTOMY    . HERNIA REPAIR    . LAPAROSCOPIC GASTRIC SLEEVE RESECTION    . TONSILLECTOMY    . TUBAL LIGATION     OB History    Gravida  2   Para  2   Term      Preterm      AB      Living        SAB      TAB      Ectopic      Multiple      Live Births             Home Medications    Prior to Admission medications   Medication Sig Start Date End Date Taking? Authorizing Provider  acetaminophen (TYLENOL) 325 MG tablet Take 3 tablets (975 mg total) by mouth every 6 (six) hours as needed for moderate pain. 07/01/14  Yes Betancourt, Jarold Song, NP  albuterol (PROAIR HFA) 108 (90 Base) MCG/ACT inhaler Inhale into the lungs. 10/04/13  Yes [provider]  fluticasone (FLOVENT HFA) 110 MCG/ACT inhaler Inhale 2 puffs into the lungs as needed.   Yes [provider]  omeprazole (PRILOSEC) 40 MG capsule Take 40 mg by mouth daily.   Yes [provider]  tiotropium (SPIRIVA) 18 MCG inhalation capsule Place 18 mcg into inhaler and inhale as needed.   Yes [provider]  mupirocin ointment (BACTROBAN) 2 % Apply 1 application topically 2 (two) times daily for 7 days. 01/21/18 01/28/18  Tommie Sams, DO   Family History Family History  Problem Relation Age of Onset  .  Cancer Mother    Social History Social History   Tobacco Use  . Smoking status: Current Some Day Smoker    Types: Cigarettes  . Smokeless tobacco: Former Neurosurgeon    Quit date: 06/26/2007  Substance Use Topics  . Alcohol use: Yes    Comment: socially  . Drug use: No   Allergies   Patient has no known allergies.   Review of Systems Review of Systems  Constitutional: Negative.   Skin:       Dry, cracked lips.   Physical Exam Triage Vital Signs ED Triage Vitals  Enc Vitals Group     BP 01/21/18 1702 124/85     Pulse Rate 01/21/18 1702 90     Resp 01/21/18 1702 18     Temp 01/21/18 1702 98.7 F (37.1 C)     Temp Source 01/21/18 1702 Oral     SpO2 01/21/18 1702 98 %     Weight 01/21/18 1659 220 lb (99.8 kg)     Height 01/21/18 1659 5' (1.524 m)     Head Circumference --      Peak Flow --  Pain Score 01/21/18 1659 7     Pain Loc --      Pain Edu? --      Excl. in GC? --    Updated Vital Signs BP 124/85 (BP Location: Left Arm)   Pulse 90   Temp 98.7 F (37.1 C) (Oral)   Resp 18   Ht 5' (1.524 m)   Wt 99.8 kg   LMP 06/24/2014 (Approximate) Comment: denies preg  SpO2 98%   BMI 42.97 kg/m   Visual Acuity Right Eye Distance:   Left Eye Distance:   Bilateral Distance:    Right Eye Near:   Left Eye Near:    Bilateral Near:     Physical Exam  Constitutional: She is oriented to person, place, and time. She appears well-developed. No distress.  HENT:  Head: Normocephalic and atraumatic.  Eyes: Conjunctivae are normal. Right eye exhibits no discharge. Left eye exhibits no discharge.  Cardiovascular: Normal rate and regular rhythm.  Pulmonary/Chest: Effort normal. No respiratory distress.  Neurological: She is alert and oriented to person, place, and time.  Psychiatric: She has a normal mood and affect. Her behavior is normal.  Nursing note and vitals reviewed.  UC Treatments / Results  Labs (all labs ordered are listed, but only abnormal results are  displayed) Labs Reviewed - No data to display  EKG None  Radiology No results found.  Procedures Procedures (including critical care time)  Medications Ordered in UC Medications - No data to display  Initial Impression / Assessment and Plan / UC Course  I have reviewed the triage vital signs and the nursing notes.  Pertinent labs & imaging results that were available during my care of the patient were reviewed by me and considered in my medical decision making (see chart for details).    52 year old female presents with dry cracked lips.  Advised to avoid products that she has been using.  No new products.  Bactroban ointment.  Supportive care.  Final Clinical Impressions(s) / UC Diagnoses   Final diagnoses:  Dry lips     Discharge Instructions     Use the medication as prescribed.  See [facility] regarding your chronic knee pain.  Take care  Dr. Adriana Simasook    ED Prescriptions    Medication Sig Dispense Auth. Provider   mupirocin ointment (BACTROBAN) 2 % Apply 1 application topically 2 (two) times daily for 7 days. 22 g Tommie Samsook, Finnley Lewis G, DO     Controlled Substance Prescriptions [place] Controlled Substance Registry consulted? Not Applicable   Tommie SamsCook, Falynn Ailey G, DO 01/21/18 2000

## 2018-01-21 NOTE — ED Triage Notes (Signed)
Patient complains of dry, cracked lips for 1 month. States that she has been using a lot of chapstick. Patient states that she cant eat due to her lips. Patient states that she previously used a lot of lipstick and chapsticks.

## 2018-02-08 ENCOUNTER — Encounter: Payer: Self-pay | Admitting: Gynecology

## 2018-02-08 ENCOUNTER — Other Ambulatory Visit: Payer: Self-pay

## 2018-02-08 ENCOUNTER — Ambulatory Visit
Admission: EM | Admit: 2018-02-08 | Discharge: 2018-02-08 | Disposition: A | Discharge status: Home / Self Care | Payer: BLUE CROSS/BLUE SHIELD | Attending: Student | Admitting: Student

## 2018-02-08 DIAGNOSIS — M170 Bilateral primary osteoarthritis of knee: Secondary | ICD-10-CM

## 2018-02-08 MED ORDER — TRAMADOL HCL 50 MG PO TABS: 50.0000 mg | ORAL_TABLET | Freq: Three times a day (TID) | ORAL | 0 refills | Status: AC | PRN

## 2018-02-08 MED ORDER — PREDNISONE 10 MG PO TABS: ORAL_TABLET | ORAL | 0 refills | Status: AC

## 2018-02-08 MED ORDER — CELECOXIB 200 MG PO CAPS: 200.0000 mg | ORAL_CAPSULE | Freq: Two times a day (BID) | ORAL | 0 refills | Status: AC

## 2018-02-08 NOTE — Discharge Instructions (Addendum)
-  Take medications as directed. -Follow-up at already scheduled appointment for your bilateral knee pain in January.

## 2018-02-08 NOTE — ED Triage Notes (Signed)
Per patient severe pain in joints. Per patient bilateral knee pain. Pt stated has an appointment with PCP next month.

## 2018-02-08 NOTE — ED Provider Notes (Signed)
[facility]    CSN: [id number] Arrival date & time: 02/08/18  4098     History   Chief Complaint No chief complaint on file.   HPI Catherine Nolan is a 52 y.o. female for evaluation of bilateral knee pain.  Patient reports knee pain ongoing for several years, no injury or falls recently.  Denies any surgical history to bilateral knees.  Patient reports that her right knee pain is worse than left knee pain.  States that her pain is an aching, throbbing sensation.  No personal history of gout.  Patient presents today in a wheelchair.  Denies any catching or locking symptoms in bilateral knees.  She is taking Advil as needed for discomfort at home.  HPI  Past Medical History:  Diagnosis Date  . Arthritis   . Asthma   . GERD (gastroesophageal reflux disease)   . Neuropathy     There are no active problems to display for this patient.   Past Surgical History:  Procedure Laterality Date  . CHOLECYSTECTOMY    . HERNIA REPAIR    . LAPAROSCOPIC GASTRIC SLEEVE RESECTION    . TONSILLECTOMY    . TUBAL LIGATION      OB History    Gravida  2   Para  2   Term      Preterm      AB      Living        SAB      TAB      Ectopic      Multiple      Live Births               Home Medications    Prior to Admission medications   Medication Sig Start Date End Date Taking? Authorizing Provider  acetaminophen (TYLENOL) 325 MG tablet Take 3 tablets (975 mg total) by mouth every 6 (six) hours as needed for moderate pain. 07/01/14  Yes Betancourt, Jarold Song, NP  albuterol (PROAIR HFA) 108 (90 Base) MCG/ACT inhaler Inhale into the lungs. 10/04/13  Yes [provider]  fluticasone (FLOVENT HFA) 110 MCG/ACT inhaler Inhale 2 puffs into the lungs as needed.   Yes [provider]  omeprazole (PRILOSEC) 40 MG capsule Take 40 mg by mouth daily.   Yes [provider]  tiotropium (SPIRIVA) 18 MCG inhalation capsule Place 18 mcg into inhaler and  inhale as needed.   Yes [provider]  celecoxib (CELEBREX) 200 MG capsule Take 1 capsule (200 mg total) by mouth 2 (two) times daily. 02/08/18   Anson Oregon, PA-C  predniSONE (DELTASONE) 10 MG tablet Take 6 tablets on the first day and then taper down by 1 tablet each day following the first. 02/08/18   Anson Oregon, PA-C  traMADol (ULTRAM) 50 MG tablet Take 1 tablet (50 mg total) by mouth every 8 (eight) hours as needed. 02/08/18   Anson Oregon, PA-C    Family History Family History  Problem Relation Age of Onset  . Cancer Mother     Social History Social History   Tobacco Use  . Smoking status: Current Some Day Smoker    Types: Cigarettes  . Smokeless tobacco: Former Neurosurgeon    Quit date: 06/26/2007  Substance Use Topics  . Alcohol use: Yes    Comment: socially  . Drug use: No     Allergies   Patient has no known allergies.   Review of Systems Review of Systems  Musculoskeletal:  Positive for arthralgias, gait problem and joint swelling.  All other systems reviewed and are negative.    Physical Exam Triage Vital Signs ED Triage Vitals  Enc Vitals Group     BP 02/08/18 1003 111/76     Pulse Rate 02/08/18 1003 87     Resp 02/08/18 1003 16     Temp 02/08/18 1003 (!) 97.5 F (36.4 C)     Temp Source 02/08/18 1003 Oral     SpO2 02/08/18 1003 100 %     Weight 02/08/18 1006 220 lb (99.8 kg)     Height --      Head Circumference --      Peak Flow --      Pain Score 02/08/18 1005 6     Pain Loc --      Pain Edu? --      Excl. in GC? --    No data found.  Updated Vital Signs BP 111/76 (BP Location: Left Arm)   Pulse 87   Temp (!) 97.5 F (36.4 C) (Oral)   Resp 16   Wt 220 lb (99.8 kg)   LMP 06/24/2014 (Approximate) Comment: denies preg  SpO2 100%   BMI 42.97 kg/m   Visual Acuity Right Eye Distance:   Left Eye Distance:   Bilateral Distance:    Right Eye Near:   Left Eye Near:    Bilateral Near:     Physical  Exam Bilateral Lower Extremities: Examination of the bilateral lower extremity reveals no bony abnormality, mild edema, no effusion and no ecchymosis.  There mild bilateral valgus abnormality.  The patient is tender along the lateral joint line, and is tender along the medial joint line.  The patient is able to achieve full extension bilaterally, flexion up to 90 degrees of moderate discomfort bilaterally.  The patient has a negative rotational Mcmurray test.  There is no retropatellar discomfort.  The patient has a negative patella stretch test.  The patient has a negative varus stress test and a negative valgus stress test, in looking for stability.  The patient has a negative Lachman's test.  Vascular: The patient has a negative Denna HaggardHomans' test bilaterally.  The patient had a normal dorsalis pedis and posterior tibial pulse.  There is normal skin warmth.  There is normal capillary refill bilaterally.    Neurologic: The patient has a negative straight leg raise.  The patient has normal muscle strength testing for the quadriceps, calves, ankle dorsiflexion, ankle plantarflexion, and extensor hallicus longus.  The patient has sensation that is intact to light touch.  The deep tendon reflexes are normal at the   UC Treatments / Results  Labs (all labs ordered are listed, but only abnormal results are displayed) Labs Reviewed - No data to display  EKG None  Radiology No results found.  Procedures Procedures (including critical care time)  Medications Ordered in UC Medications - No data to display  Initial Impression / Assessment and Plan / UC Course  I have reviewed the triage vital signs and the nursing notes.  Pertinent labs & imaging results that were available during my care of the patient were reviewed by me and considered in my medical decision making (see chart for details).     1.  Treatment options were discussed today with the patient. 2.  The patient states that she has an  appointment in the next several weeks to discuss her ongoing bilateral knee pain.  She was offered but declines x-rays at today's appointment.  3.  The patient was given a prescription for Celebrex to take at home, in addition she was also given a prednisone taper.  She was given a one-time prescription of tramadol for discomfort. 4.  The patient is to contact clinic if she has any questions, concerns or new symptoms develop. Final Clinical Impressions(s) / UC Diagnoses   Final diagnoses:  Primary osteoarthritis of knees, bilateral     Discharge Instructions     -Take medications as directed. -Follow-up at already scheduled appointment for your bilateral knee pain in January.   ED Prescriptions    Medication Sig Dispense Auth. Provider   celecoxib (CELEBREX) 200 MG capsule Take 1 capsule (200 mg total) by mouth 2 (two) times daily. 60 capsule Anson OregonMcGhee,  Lance, PA-C   predniSONE (DELTASONE) 10 MG tablet Take 6 tablets on the first day and then taper down by 1 tablet each day following the first. 15 tablet Anson OregonMcGhee,  Lance, PA-C   traMADol (ULTRAM) 50 MG tablet Take 1 tablet (50 mg total) by mouth every 8 (eight) hours as needed. 15 tablet Anson OregonMcGhee,  Lance, PA-C     Controlled Substance Prescriptions  Controlled Substance Registry consulted? Not Applicable   Anson OregonMcGhee,  Lance, PA-C 02/08/18 1211

## 2019-02-16 ENCOUNTER — Ambulatory Visit: Payer: Self-pay

## 2019-02-16 NOTE — Telephone Encounter (Signed)
Pt tested positive for covid 19 yesterday. Pt would like to know how long covid 19 virus stays in your system. Pt was sick on dec 19 for about a week  Called pt and discussed issue . Pt stated her sx started on 12/19 20 and pt has been in quarantine for 17 days. Pt tested positive again yesterday. Pt is concerned about how much longer she should be in isolation. Pt is a Interior and spatial designer. Pt was advised that though her sx have gone, due to the testing yesterday and was positive that she should quratnine for the rest of the week. She lives with family members who tested positive as well. Advised pt of sx of covid 19 and advised her to call her PCP for further instructions. Pt advised of reinfection data from the CDC. Advised pt's boyfriend to stay away from pt due to covid results pending. Discussed incubation period and CDC advised not to retest for up to 90 days. Care advice given and pt verbalized understanding.         Reason for Disposition . General information question, no triage required and triager able to answer question  Protocols used: INFORMATION ONLY CALL - NO TRIAGE-A-AH

## 2019-10-28 IMAGING — US US BREAST*R* LIMITED INC AXILLA
1 series · 2 of 2 positions shown · non-contrast
Comparison: Previous exam(s).

CLINICAL DATA: Patient describes bilateral palpable lumps. Status
post gastric bypass with approximately 200 pound weight loss.

EXAM:
DIGITAL DIAGNOSTIC BILATERAL MAMMOGRAM WITH CAD AND TOMO
ULTRASOUND BILATERAL BREAST

[Series 2: us breast*right* limited inc axilla · 0.07mm/px · 2 of 2 slices shown]
[im 1/2]
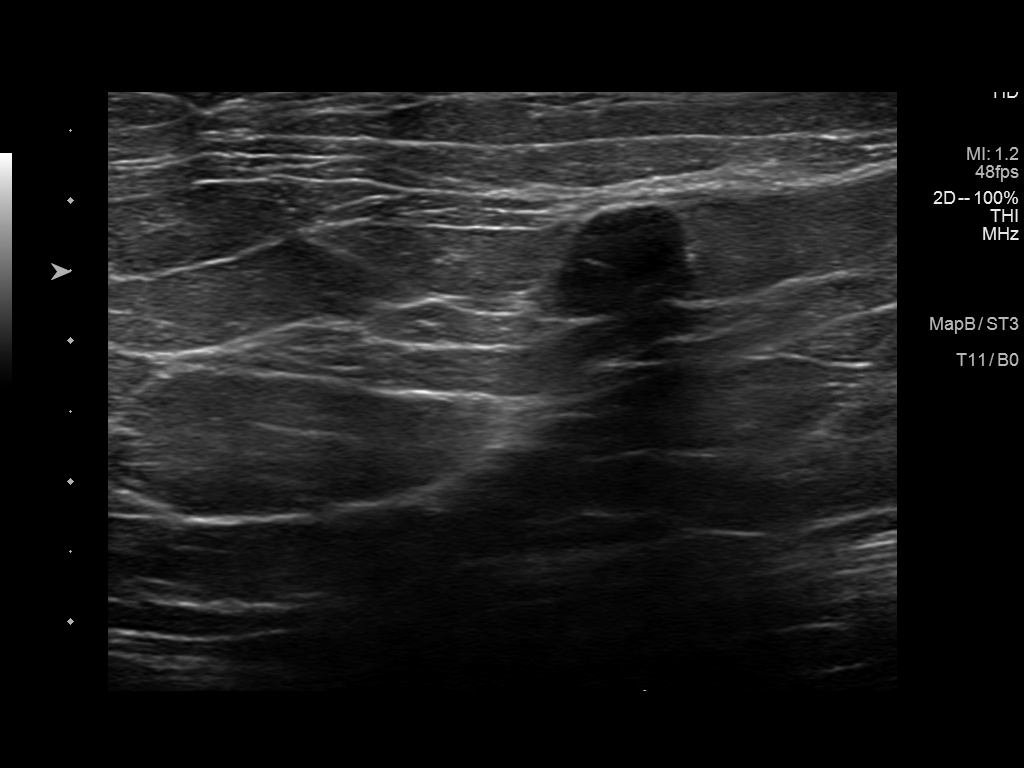
[im 2/2]
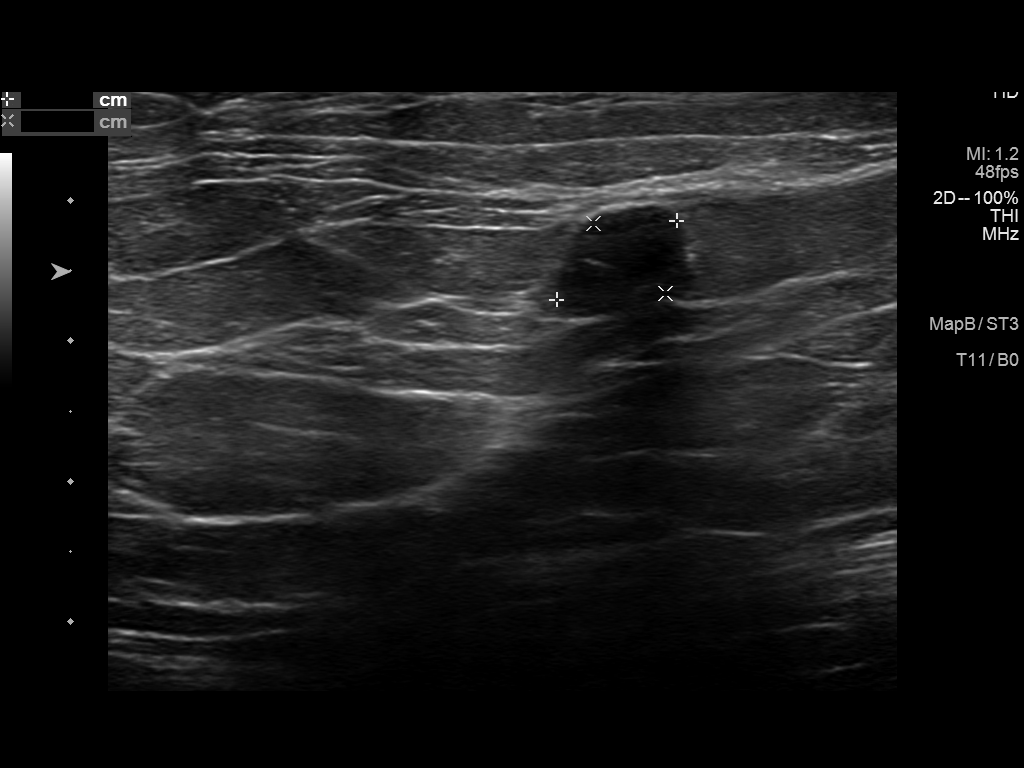

[2 of 2 positions shown; findings below may reference images not displayed]

ACR Breast Density Category b: There are scattered areas of
fibroglandular density.
FINDINGS: There is a stable benign circumscribed mass within the outer RIGHT
breast, stable for nearly 4 years confirming benignity, located in
the vicinity of the area of clinical concern, with overlying skin
marker in place. There are no new dominant masses, suspicious
calcifications or secondary signs of malignancy identified within
the RIGHT breast.

There are no new dominant masses, suspicious calcifications or
secondary signs of malignancy addendum within the LEFT breast.
Specifically, there is no mammographic abnormality within the
upper-outer quadrant of the LEFT breast corresponding to the area of
clinical concern.

Mammographic images were processed with CAD.

RIGHT breast: Targeted ultrasound is performed, evaluating outer
RIGHT breast as directed by the patient, showing an oval
circumscribed hypoechoic mass at the 10 o'clock axis, 6 cm from the
nipple, measuring 10 mm, corresponding to the stable benign
mammographic finding, presumed fibroadenoma. No additional solid or
cystic mass is identified within the upper-outer quadrant of the
RIGHT breast.

LEFT breast: Targeted ultrasound is performed, evaluating the outer
LEFT breast as directed by the patient, showing only normal
fibroglandular tissues and fat lobules throughout. No solid or
cystic mass is identified.
IMPRESSION: No evidence of malignancy within either breast. Stable benign mass
within the upper-outer quadrant of the RIGHT breast, presumed
fibroadenoma, corresponding to the area of clinical concern.

RECOMMENDATION:
Screening mammogram in one year.(Code:[id number])

The patient was instructed to return sooner if the area that she
feels becomes larger and/or firmer to palpation, or if a new
palpable abnormality is identified in either breast.

I have discussed the findings and recommendations with the patient.
Results were also provided in writing at the conclusion of the
visit. If applicable, a reminder letter will be sent to the patient
regarding the next appointment.

BI-RADS CATEGORY  2: Benign.
